# Patient Record
Sex: Female | Born: 1985 | Race: White | Hispanic: No | Marital: Single | Smoking: Former smoker
Health system: Southern US, Community
[De-identification: ages and names within clinical notes are randomized; demographics above are authoritative.]

---

## 2006-07-18 ENCOUNTER — Emergency Department (HOSPITAL_COMMUNITY): Admission: EM | Admit: 2006-07-18 | Discharge: 2006-07-18 | Payer: Self-pay | Admitting: Emergency Medicine
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
## 2007-11-02 ENCOUNTER — Inpatient Hospital Stay (HOSPITAL_COMMUNITY): Admission: AD | Admit: 2007-11-02 | Discharge: 2007-11-03 | Payer: Self-pay | Admitting: Obstetrics and Gynecology

## 2007-11-17 ENCOUNTER — Ambulatory Visit (HOSPITAL_COMMUNITY): Admission: RE | Admit: 2007-11-17 | Discharge: 2007-11-17 | Payer: Self-pay | Admitting: Obstetrics and Gynecology

## 2007-11-27 ENCOUNTER — Ambulatory Visit: Payer: Self-pay | Admitting: Family

## 2007-11-27 ENCOUNTER — Inpatient Hospital Stay (HOSPITAL_COMMUNITY): Admission: AD | Admit: 2007-11-27 | Discharge: 2007-11-27 | Payer: Self-pay | Admitting: Obstetrics & Gynecology

## 2008-02-17 ENCOUNTER — Ambulatory Visit: Payer: Self-pay | Admitting: Obstetrics & Gynecology

## 2008-02-17 ENCOUNTER — Observation Stay (HOSPITAL_COMMUNITY): Admission: AD | Admit: 2008-02-17 | Discharge: 2008-02-18 | Payer: Self-pay | Admitting: Obstetrics & Gynecology

## 2009-06-01 IMAGING — US US OB COMP +14 WK
1 series · 14 of 25 positions shown · non-contrast
Comparison: none

02/21/08 ? CORRECTED EXAM ORDER:  This study was ordered in error as US OB COMPARISON IS MADE +14 WK and should have been US OB RE-EVAL.  The patient?s account has been appropriately adjusted.
 OBSTETRICAL ULTRASOUND:
 This ultrasound exam was performed in the [HOSPITAL] Ultrasound Department.  The OB US report was generated in the AS system, and faxed to the ordering physician.  This report is also available in [REDACTED] PACS.

[Series 1: us ob comp +14 wk · 0.35mm/px · 14 of 25 slices shown]
[im 1/25]
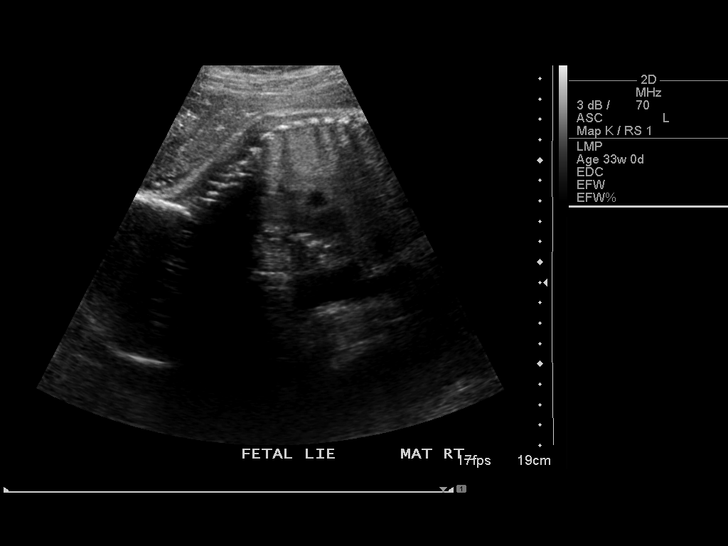
[im 3/25]
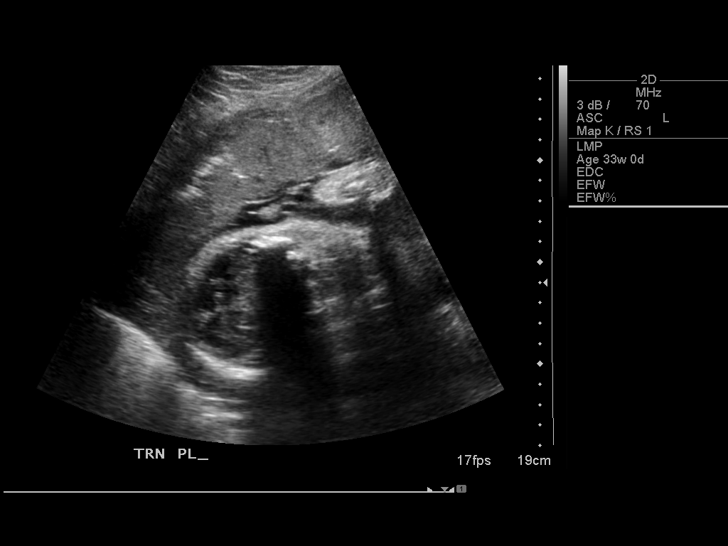
[im 5/25]
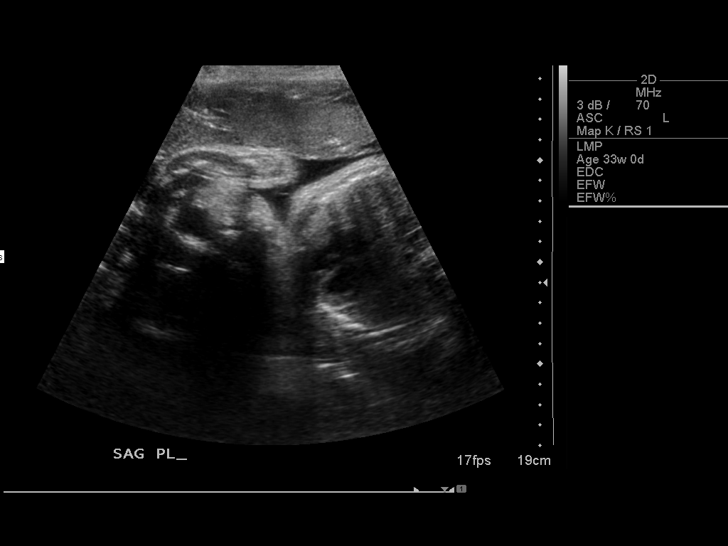
[im 7/25]
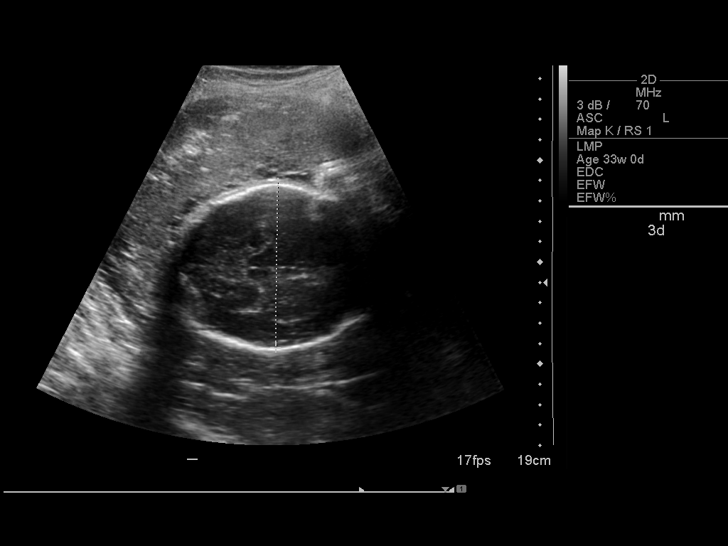
[im 9/25]
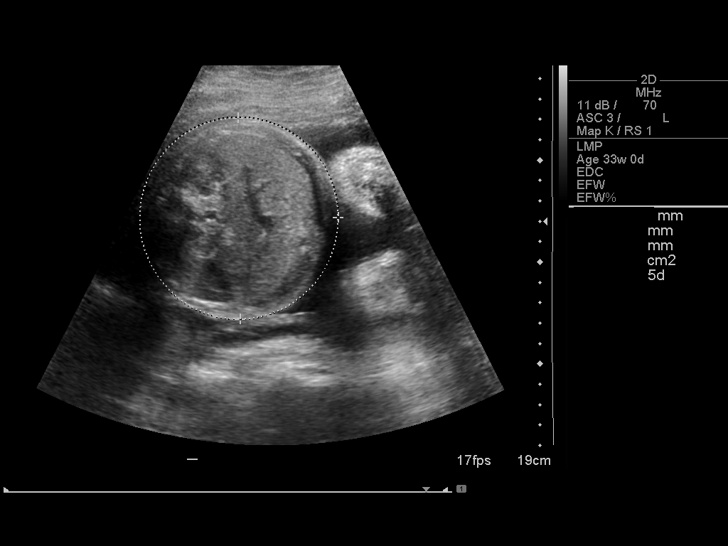
[im 10/25]
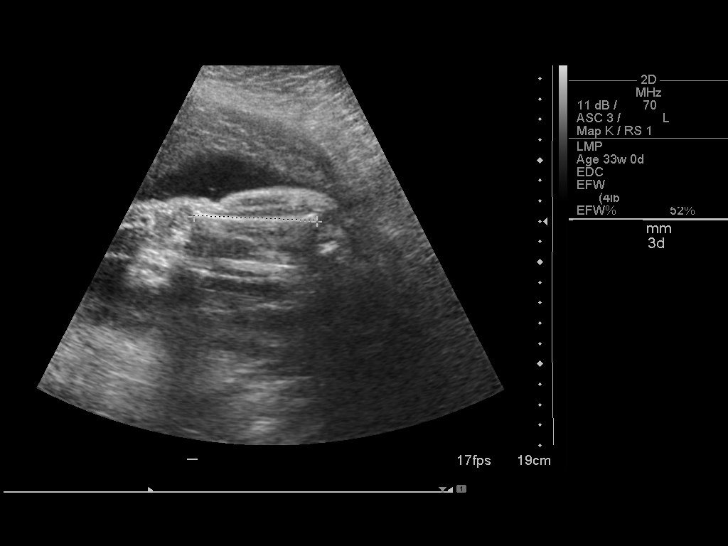
[im 12/25]
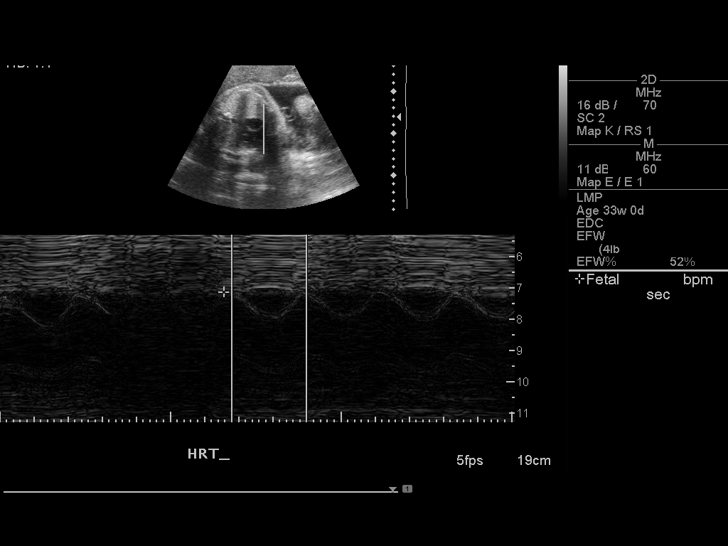
[im 14/25]
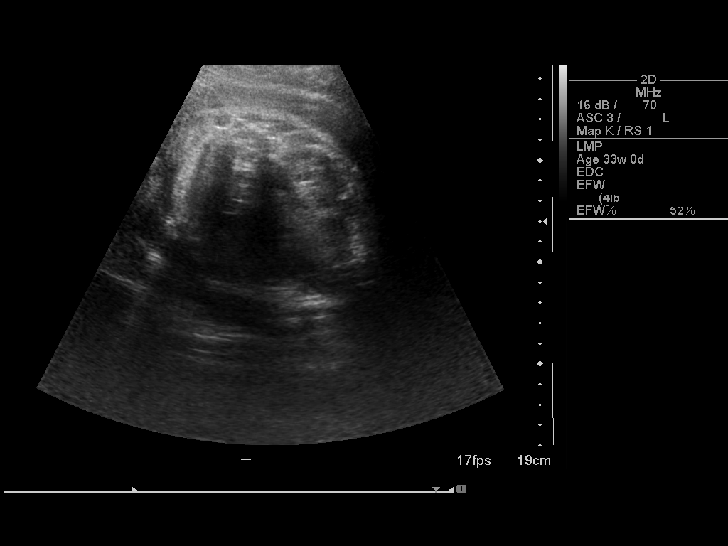
[im 16/25]
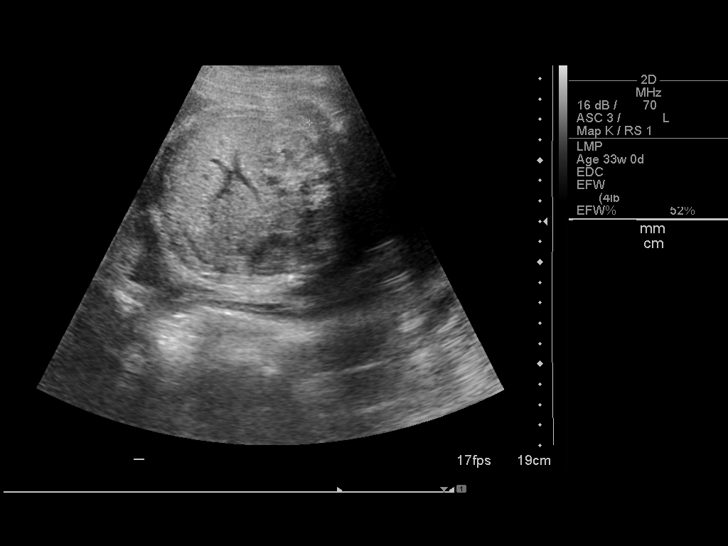
[im 17/25]
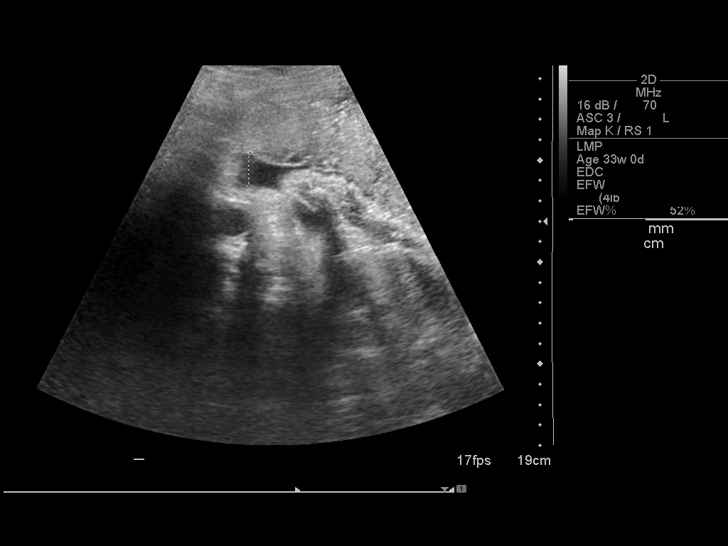
[im 19/25]
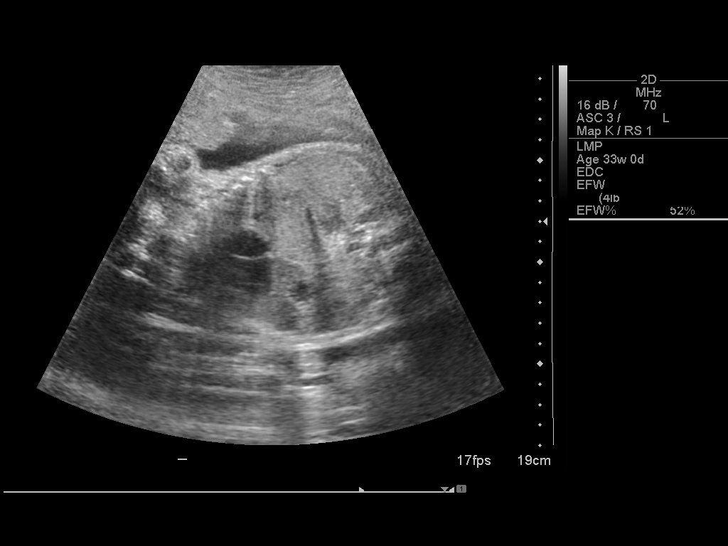
[im 21/25]
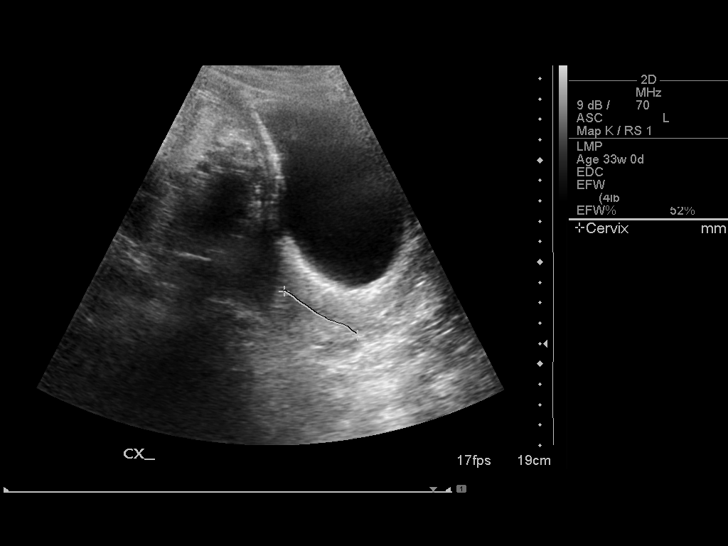
[im 23/25]
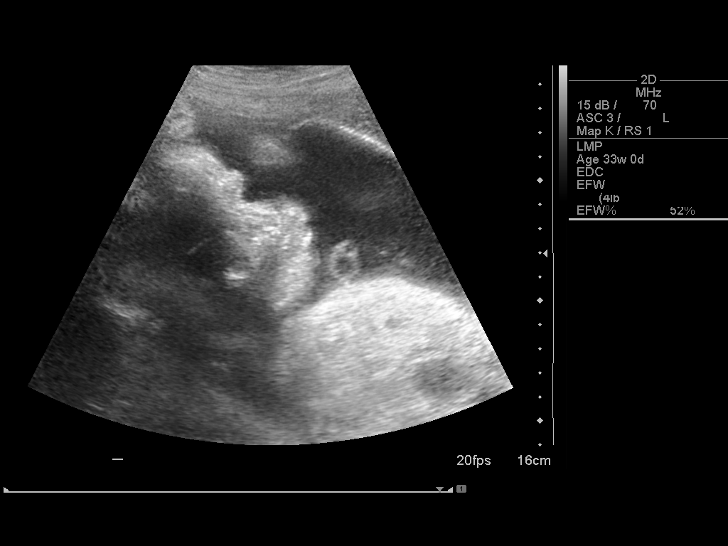
[im 25/25]
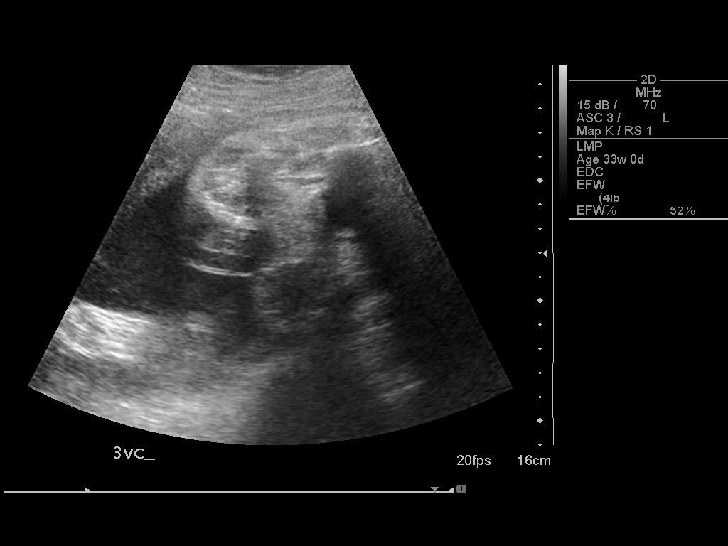

[14 of 25 positions shown; findings below may reference images not displayed]

IMPRESSION: See [facility] Obstetric US report.

## 2011-01-20 NOTE — Discharge Summary (Signed)
Lauren Sexton, GUILLERMO               ACCOUNT NO.:  [id number]   MEDICAL RECORD NO.:  [id number]          PATIENT TYPE:  OBV   LOCATION:  9161                          FACILITY:  [facility]   PHYSICIAN:  Allie Bossier, MD        DATE OF BIRTH:  [birth year]   DATE OF ADMISSION:  02/17/2008  DATE OF DISCHARGE:  02/18/2008                               DISCHARGE SUMMARY   ADMISSION DIAGNOSES:  1. Intrauterine pregnancy at 33 weeks' 0-day gestation.  2. Preterm contractions.  3. History of low transverse cesarean section x1.  4. Late prenatal care at 27 weeks'.   DISCHARGE DIAGNOSES:  1. Intrauterine pregnancy at 33 weeks' 1-day gestation.  2. Preterm contractions status post magnesium, now discontinued, and      Procardia.  3. History of previous cesarean section x1.  4. Bacterial vaginosis.  5. Late prenatal care at 27 weeks.   PROCEDURES:  The patient had an OB ultrasound performed on February 17, 2008, showing a single gestation in transverse presentation with head to  the maternal right and placenta was anterior above the cervical os.  Amniotic fluid index was 9.8 cm, 16th percentile.  Estimated fetal  weight was 2177 grams, 68th percentile.  Cervical length was 4.25 cm.   CONSULTATIONS:  None.   COMPLICATIONS:  None.   PERTINENT LABORATORY FINDINGS:  Upon admission, Ms. Mayford Knife had a  complete blood count with white blood cells 8.9, hemoglobin 10.7,  hematocrit 30.1, and platelets 242,000.  Wet prep showed few clue cells.  No yeast.  No Trichomonas.  Blood type is O positive.  Antibody screen  was negative.  GC and Chlamydia were negative.  Urinalysis was negative.   BRIEF PERTINENT ADMISSION HISTORY:  Ms. Mayford Knife is a 25 year old  gravida 3, para 1-0-1-1 at 30 weeks' and 0-day gestation with history of  one previous cesarean section.  She presented on February 17, 2008, to the  [facility].  The patient was evaluated there and found to be  contracting every 1-2 minutes.  Her cervix  was closed.  She was started  on magnesium sulfate 4 g load followed by 2 g an hour, which was later  increased to 3 g an hour.  After she was on the magnesium, her  contractions continued to persist.  She did have urine drug screen  performed at [facility] that was negative.  She was accepted for transfer  by Dr. Johnella Moloney.   HOSPITAL COURSE:  The patient was admitted and her magnesium was  stopped.  She was started on Procardia 10 mg p.o. every 20 minutes x4  doses and then Procardia 30 mg p.o. every 12 hours.  Cultures were sent  for wet prep, gonorrhea, chlamydia, and GBS.  The results of the labs  are returned as stated above.  The patient did receive one dose of  betamethasone for fetal lung maturity in [place], which was repeated at  1330 on February 18, 2008.  On admission, the patient's cervix was still  closed, thick, and high.  She continued to have periods of regular  contractions every 2-3 minutes, which responded to Procardia, and she  had periods without contractions.  At the time of discharge, after her  second dose of betamethasone, the patient's cervix was still closed,  long, high, and uterine contractions had diminished.  Physical  examination was otherwise normal.  The patient's vitals were stable and  she was afebrile.  There was no vaginal bleeding or loss of fluid noted  throughout her stay.  The patient was discussed with Dr. Marice Potter prior to  discharge and despite uterine contractions, there was no cervical  change, and the patient was ready for discharge.   DISCHARGE STATUS:  Stable.   DISCHARGE MEDICATIONS:  1. Continue prenatal vitamin on outpatient basis, as previously      prescribed.  2. Procardia 30 mg XL p.o. every 12 hours.  We will leave it to the      patient's discretion whether she chooses to use this medication.      Counseled about the benefits of Procardia, but it is not indicated      secondary to no cervical change.  3. Metronidazole 500 mg p.o.  b.i.d. x13 doses for bacterial vaginosis.   DISCHARGE INSTRUCTIONS:  1. Discharge to home.  2. Regular diet.  3. Activity as tolerated.  4. The patient is not to have intercourse until follow up with her      primary obstetrician in [place].  5. The patient is to follow up the next week with her primary      obstetrician in [place].      Karlton Lemon, MD  Electronically Signed     ______________________________  Allie Bossier, MD    NS/MEDQ  D:  02/18/2008  T:  02/19/2008  Job:  [id number]

## 2011-05-29 LAB — URINALYSIS, ROUTINE W REFLEX MICROSCOPIC
Specific Gravity, Urine: 1.01
Urobilinogen, UA: 0.2
pH: 7

## 2011-05-29 LAB — WET PREP, GENITAL: Clue Cells Wet Prep HPF POC: NONE SEEN

## 2011-05-29 LAB — GC/CHLAMYDIA PROBE AMP, GENITAL
Chlamydia, DNA Probe: NEGATIVE
GC Probe Amp, Genital: NEGATIVE

## 2011-06-01 LAB — URINALYSIS, ROUTINE W REFLEX MICROSCOPIC
Glucose, UA: NEGATIVE
Ketones, ur: NEGATIVE
Nitrite: NEGATIVE
Protein, ur: NEGATIVE
Urobilinogen, UA: 0.2
pH: 7

## 2011-06-01 LAB — URINE MICROSCOPIC-ADD ON

## 2011-06-04 LAB — URINALYSIS, ROUTINE W REFLEX MICROSCOPIC
Bilirubin Urine: NEGATIVE
Glucose, UA: 100 — AB
Hgb urine dipstick: NEGATIVE
Ketones, ur: NEGATIVE
Nitrite: NEGATIVE
Protein, ur: NEGATIVE
Specific Gravity, Urine: 1.01
Urobilinogen, UA: 0.2
pH: 6.5

## 2011-06-04 LAB — TYPE AND SCREEN: Antibody Screen: NEGATIVE

## 2011-06-04 LAB — STREP B DNA PROBE: Strep Group B Ag: NEGATIVE

## 2011-06-04 LAB — URINE CULTURE

## 2011-06-04 LAB — CBC
HCT: 30.1 — ABNORMAL LOW
MCHC: 35.5
Platelets: 242
WBC: 8.9

## 2011-06-04 LAB — DIFFERENTIAL
Eosinophils Absolute: 0
Eosinophils Relative: 0
Lymphocytes Relative: 13
Lymphs Abs: 1.2
Neutro Abs: 7.4
Neutrophils Relative %: 83 — ABNORMAL HIGH

## 2011-06-04 LAB — GC/CHLAMYDIA PROBE AMP, GENITAL: Chlamydia, DNA Probe: NEGATIVE

## 2011-06-04 LAB — WET PREP, GENITAL: Trich, Wet Prep: NONE SEEN

## 2014-03-31 ENCOUNTER — Encounter (HOSPITAL_COMMUNITY): Payer: Self-pay | Admitting: Emergency Medicine

## 2014-03-31 ENCOUNTER — Emergency Department (HOSPITAL_COMMUNITY)
Admission: EM | Admit: 2014-03-31 | Discharge: 2014-03-31 | Disposition: A | Discharge status: Home / Self Care | Payer: BC Managed Care – PPO | Attending: Emergency Medicine | Admitting: Emergency Medicine

## 2014-03-31 DIAGNOSIS — F17200 Nicotine dependence, unspecified, uncomplicated: Secondary | ICD-10-CM | POA: Insufficient documentation

## 2014-03-31 DIAGNOSIS — H53149 Visual discomfort, unspecified: Secondary | ICD-10-CM | POA: Insufficient documentation

## 2014-03-31 DIAGNOSIS — R112 Nausea with vomiting, unspecified: Secondary | ICD-10-CM | POA: Insufficient documentation

## 2014-03-31 DIAGNOSIS — R51 Headache: Secondary | ICD-10-CM | POA: Insufficient documentation

## 2014-03-31 DIAGNOSIS — R519 Headache, unspecified: Secondary | ICD-10-CM

## 2014-03-31 MED ORDER — KETOROLAC TROMETHAMINE 30 MG/ML IJ SOLN
30.0000 mg | Freq: Once | INTRAMUSCULAR | Status: AC
  Administered 2014-03-31: 30 mg via INTRAVENOUS
  Filled 2014-03-31: qty 1

## 2014-03-31 MED ORDER — METOCLOPRAMIDE HCL 5 MG/ML IJ SOLN
10.0000 mg | INTRAMUSCULAR | Status: AC
  Administered 2014-03-31: 10 mg via INTRAVENOUS
  Filled 2014-03-31: qty 2

## 2014-03-31 MED ORDER — OXYCODONE-ACETAMINOPHEN 5-325 MG PO TABS
1.0000 | ORAL_TABLET | Freq: Once | ORAL | Status: AC
  Administered 2014-03-31: 1 via ORAL
  Filled 2014-03-31: qty 1

## 2014-03-31 MED ORDER — SODIUM CHLORIDE 0.9 % IV BOLUS (SEPSIS)
1000.0000 mL | Freq: Once | INTRAVENOUS | Status: AC
  Administered 2014-03-31: 1000 mL via INTRAVENOUS

## 2014-03-31 MED ORDER — DIPHENHYDRAMINE HCL 50 MG/ML IJ SOLN
25.0000 mg | Freq: Once | INTRAMUSCULAR | Status: AC
  Administered 2014-03-31: 25 mg via INTRAVENOUS
  Filled 2014-03-31: qty 1

## 2014-03-31 NOTE — ED Provider Notes (Signed)
CSN: [id number]     Arrival date & time 03/31/14  58 History   First MD Initiated Contact with Patient 03/31/14 2053     Chief Complaint  Patient presents with  . Headache    (Consider location/radiation/quality/duration/timing/severity/associated sxs/prior Treatment) HPI Comments: Patient is a 28 year old female with no significant past medical history who presents to the emergency department for a right sided headache. Patient states that headache has been constant x21 hours. She states that the pain is tension-like in nature. She has tried non-aspirin and ibuprofen her symptoms without relief. Symptoms associated with photophobia, phonophobia, nausea, and emesis times one which was nonbloody. Patient denies thunderclap onset of symptoms. She further denies associated fever, neck stiffness, vision changes or vision loss, tinnitus or hearing loss, numbness/paresthesias, and weakness.  Patient is a 28 y.o. female presenting with headaches. The history is provided by the patient. No language interpreter was used.  Headache Associated symptoms: nausea, photophobia and vomiting   Associated symptoms: no abdominal pain, no fever, no neck stiffness and no numbness     History reviewed. No pertinent past medical history. History reviewed. No pertinent past surgical history. History reviewed. No pertinent family history. History  Substance Use Topics  . Smoking status: Current Every Day Smoker    Types: Cigarettes  . Smokeless tobacco: Not on file  . Alcohol Use: No   OB History   Grav Para Term Preterm Abortions TAB SAB Ect Mult Living                  Review of Systems  Constitutional: Negative for fever and chills.  HENT: Negative for trouble swallowing.        +phonophobia  Eyes: Positive for photophobia.  Respiratory: Negative for shortness of breath.   Cardiovascular: Negative for chest pain.  Gastrointestinal: Positive for nausea and vomiting. Negative for abdominal pain.   Musculoskeletal: Negative for neck stiffness.  Neurological: Positive for headaches. Negative for syncope, facial asymmetry, weakness and numbness.  All other systems reviewed and are negative.    Allergies  Review of patient's allergies indicates no known allergies.  Home Medications   Prior to Admission medications   Not on File   BP 103/65  Pulse 56  Temp(Src) 97.9 F (36.6 C) (Oral)  Resp 25  Ht 5\' 4"  (1.626 m)  Wt 180 lb (81.647 kg)  BMI 30.88 kg/m2  SpO2 98%  LMP 03/31/2014  Physical Exam  Nursing note and vitals reviewed. Constitutional: She is oriented to person, place, and time. She appears well-developed and well-nourished. No distress.  Nontoxic/nonseptic appearing  HENT:  Head: Normocephalic and atraumatic.  Mouth/Throat: Oropharynx is clear and moist. No oropharyngeal exudate.  Eyes: Conjunctivae and EOM are normal. Pupils are equal, round, and reactive to light. No scleral icterus.  Normal EOMs without nystagmus  Neck: Normal range of motion. Neck supple.  No nuchal rigidity or meningismus  Cardiovascular: Normal rate, regular rhythm and intact distal pulses.   Pulmonary/Chest: Effort normal. No respiratory distress. She has no wheezes.  Chest expansion symmetric  Musculoskeletal: Normal range of motion.  Neurological: She is alert and oriented to person, place, and time. No cranial nerve deficit. She exhibits normal muscle tone. Coordination normal.  GCS 15. Speech is goal oriented. No cranial nerve deficits appreciated; symmetric eyebrow raise, no facial drooping, tongue midline. Finger to nose intact. No pronator drift. Equal grip strength bilaterally with normal strength against resistance in all extremities. Patient moves extremities without ataxia. DTRs normal and symmetric.  Skin: Skin is warm and dry. No rash noted. She is not diaphoretic. No erythema. No pallor.  Psychiatric: She has a normal mood and affect. Her behavior is normal.    ED Course   Procedures (including critical care time) Labs Review Labs Reviewed - No data to display  Imaging Review No results found.   EKG Interpretation None      MDM   Final diagnoses:  Headache, unspecified headache type    Patient presents for headache. Headache without thunderclap onset. No head trauma PTA. No associated fever or neck stiffness. No nuchal rigidity or meningismus on exam. Neurologic exam nonfocal and symptoms improved over ED course with migraine cocktail. Patient states she feels comfortable managing symptoms further at home. Doubt emergent intracranial process or meningitis. Patient stable and appropriate for discharge with instruction followup with a primary care provider. Return precautions provided and patient agreeable to plan with no unaddressed concerns.   Filed Vitals:   03/31/14 2045 03/31/14 2115 03/31/14 2145 03/31/14 2215  BP: 115/65 113/96 103/63 103/65  Pulse: 72 79 68 56  Temp:      TempSrc:      Resp: 18 21 15 25   Height:      Weight:      SpO2: 98% 100% 99% 98%     Antonietta Breach, PA-C 03/31/14 2245

## 2014-03-31 NOTE — ED Notes (Addendum)
Pt reports severe right side headache since last night, radiates down right side of face and into right neck. Had n/v today and sensitivity to light. No relief with ibuprofen and tyelonol at home.

## 2014-03-31 NOTE — ED Notes (Signed)
Family at bedside. 

## 2014-03-31 NOTE — Discharge Instructions (Signed)

## 2014-04-08 NOTE — ED Provider Notes (Signed)
I personally performed the services described in this documentation, which was scribed in my presence. The recorded information has been reviewed and is accurate.   Tanna Furry, MD 04/08/14 1100

## 2019-01-20 DIAGNOSIS — Z79811 Long term (current) use of aromatase inhibitors: Secondary | ICD-10-CM

## 2019-01-20 DIAGNOSIS — Z170 Estrogen receptor positive status [ER+]: Secondary | ICD-10-CM

## 2019-01-20 DIAGNOSIS — C50911 Malignant neoplasm of unspecified site of right female breast: Secondary | ICD-10-CM

## 2019-07-28 DIAGNOSIS — Z170 Estrogen receptor positive status [ER+]: Secondary | ICD-10-CM

## 2019-07-28 DIAGNOSIS — C50911 Malignant neoplasm of unspecified site of right female breast: Secondary | ICD-10-CM

## 2019-07-28 DIAGNOSIS — Z79811 Long term (current) use of aromatase inhibitors: Secondary | ICD-10-CM

## 2020-07-07 NOTE — Progress Notes (Deleted)
  [facility]  [address] [place],  [place]  [address] [phone]  Clinic Day:  07/07/2020  Referring physician: No ref. provider found   HISTORY OF PRESENT ILLNESS:  The patient is a 34 y.o. 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  She underwent adjuvant TCHP chemotherapy, followed by maintenance Herceptin/Perjeta to complete 1 full year of anti-Her2 therapy. She also had reconstructive breast surgery.  Furthermore, to enhance her chances of not having breast cancer redevelop, she underwent a bilateral salpingo-oophorectomy.  All of these interventions were performed when she lived in [place].  Since I have been following her, her exams have shown no evidence of disease recurrence.  She comes in today for routine follow-up.  Since her last visit, the patient has been doing well.  She denies having _______ symptoms or findings which concern her for overt signs of disease progression.     PHYSICAL EXAM:  There were no vitals taken for this visit. Wt Readings from Last 3 Encounters:  06/30/20 213 lb 6.4 oz (96.8 kg)  03/31/14 180 lb (81.6 kg)   There is no height or weight on file to calculate BMI. Performance status (ECOG): {CHL ONC [id number] Physical Exam  LABS:   CBC 02/17/2008  WBC 8.9  Hemoglobin 10.7(L)  Hematocrit 30.1(L)  Platelets 242   No flowsheet data found.   No results found for: CEA1 / No results found for: CEA1 No results found for: PSA1 No results found for: DCV013 No results found for: CAN125  No results found for: TOTALPROTELP, ALBUMINELP, A1GS, A2GS, BETS, BETA2SER, GAMS, MSPIKE, SPEI No results found for: TIBC, FERRITIN, IRONPCTSAT No results found for: LDH  STUDIES:  No results found.    ASSESSMENT & PLAN:   Assessment/Plan:  A 34 y.o. female with stage IIIA (T3 N1 M0) hormone/Her-2-Neu receptor positive breast  cancer.  Based upon her physical exam today, the patient remains disease-free.  She knows to continue taking her daily exemestane for at least 5 total years of adjuvant endocrine therapy.  With respect to her disease surveillance, I will continue to follow her with physical exams every 6 months.  .The patient understands all the plans discussed today and is in agreement with them.      Darly Massi Macarthur Critchley, MD

## 2020-07-08 ENCOUNTER — Inpatient Hospital Stay: Payer: Medicare Other | Attending: Oncology | Admitting: Oncology

## 2020-07-30 ENCOUNTER — Encounter (HOSPITAL_COMMUNITY): Payer: Self-pay | Admitting: Psychiatry

## 2020-07-30 ENCOUNTER — Telehealth (INDEPENDENT_AMBULATORY_CARE_PROVIDER_SITE_OTHER): Payer: Medicare Other | Admitting: Psychiatry

## 2020-07-30 DIAGNOSIS — F4310 Post-traumatic stress disorder, unspecified: Secondary | ICD-10-CM

## 2020-07-30 DIAGNOSIS — G629 Polyneuropathy, unspecified: Secondary | ICD-10-CM

## 2020-07-30 DIAGNOSIS — C50919 Malignant neoplasm of unspecified site of unspecified female breast: Secondary | ICD-10-CM | POA: Insufficient documentation

## 2020-07-30 DIAGNOSIS — F0630 Mood disorder due to known physiological condition, unspecified: Secondary | ICD-10-CM
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
## 2020-09-09 ENCOUNTER — Ambulatory Visit (HOSPITAL_COMMUNITY): Payer: Medicare Other | Admitting: Licensed Clinical Social Worker

## 2020-09-09 ENCOUNTER — Telehealth (HOSPITAL_COMMUNITY): Payer: Self-pay | Admitting: Licensed Clinical Social Worker

## 2020-09-09 NOTE — Telephone Encounter (Signed)
A text was sent to [phone] at 2pm to connect for a video session. I waited in the video session for 15 minutes. There was no response.

## 2020-09-12 ENCOUNTER — Telehealth (HOSPITAL_COMMUNITY): Payer: Medicare Other | Admitting: Psychiatry

## 2021-02-13 ENCOUNTER — Telehealth: Payer: Self-pay | Admitting: Oncology

## 2021-02-13 NOTE — Telephone Encounter (Signed)
Patient referred by Heide Scales, NP for Hx: Breast CA.  Appt made for 02/21/21 Labs 3:00 - Follow Up 3:30 pm.  Updated Med Rec's in Patient's Chart

## 2021-02-18 NOTE — Progress Notes (Incomplete)
  [facility]  [address] [place],  [place]  [address] [phone]  Clinic Day:  02/21/2021  Referring physician: No ref. provider found  This document serves as a record of services personally performed by Marice Potter, MD. It was created on their behalf by Curry,Lauren E, a trained medical scribe. The creation of this record is based on the scribe's personal observations and the provider's statements to them.  HISTORY OF PRESENT ILLNESS:  Lauren Sexton is a 35 y.o. 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  She underwent adjuvant TCHP chemotherapy, followed by 1 full year of maintenance anti-Her2 therapy. She also had reconstructive breast surgery.  Furthermore, to enhance her chances of not having breast cancer redevelop, she also underwent a bilateral salpingo-oophorectomy.  She comes in today for routine follow-up.  Since her last visit, the patient has been doing well.  She denies having other symptoms or findings which concern her for overt signs of disease progression.  VITALS:  There were no vitals taken for this visit.  Wt Readings from Last 3 Encounters:  06/30/20 213 lb 6.4 oz (96.8 kg)  03/31/14 180 lb (81.6 kg)    There is no height or weight on file to calculate BMI.  Performance status (ECOG): {CHL ONC [id number]  PHYSICAL EXAM:  Physical Exam  LABS:   CBC 02/17/2008  WBC 8.9  Hemoglobin 10.7(L)  Hematocrit 30.1(L)  Platelets 242   No flowsheet data found.   STUDIES:  No results found.   ASSESSMENT & PLAN:   A 35 year old woman with stage IIIA (T3 N1 M0) her hormone/Her-2-Neu receptor positive breast cancer.  Based upon her physical exam today, the patient remains disease-free.  I reassured her that her right axillary fullness does not represent any suspicious lymphadenopathy.  She knows to continue taking her daily exemestane for at least  5 total years of adjuvant endocrine therapy.  With respect to her disease surveillance, I will continue to follow her with physical exams every 6 months.  The patient understands all the plans discussed today and is in agreement with them.    I, Rita Ohara, am acting as scribe for Marice Potter, MD    I have reviewed this report as typed by the medical scribe, and it is complete and accurate.  Dequincy Macarthur Critchley, MD

## 2021-02-21 ENCOUNTER — Inpatient Hospital Stay: Payer: Medicare Other | Attending: Oncology | Admitting: Oncology

## 2021-02-21 ENCOUNTER — Other Ambulatory Visit: Payer: Self-pay

## 2021-02-21 ENCOUNTER — Inpatient Hospital Stay: Payer: Medicare Other
